# Patient Record
Sex: Female | Born: 2000 | Race: Black or African American | Hispanic: No | Marital: Single | Smoking: Never smoker
Health system: Southern US, Community
[De-identification: ages and names within clinical notes are randomized; demographics above are authoritative.]

---
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
## 2019-03-06 IMAGING — CR DG CHEST 2V
2 series · 2 of 2 positions shown · non-contrast
Comparison: None.

CLINICAL DATA: Cough, congestion.

EXAM:
CHEST - 2 VIEW

[w chest pa]
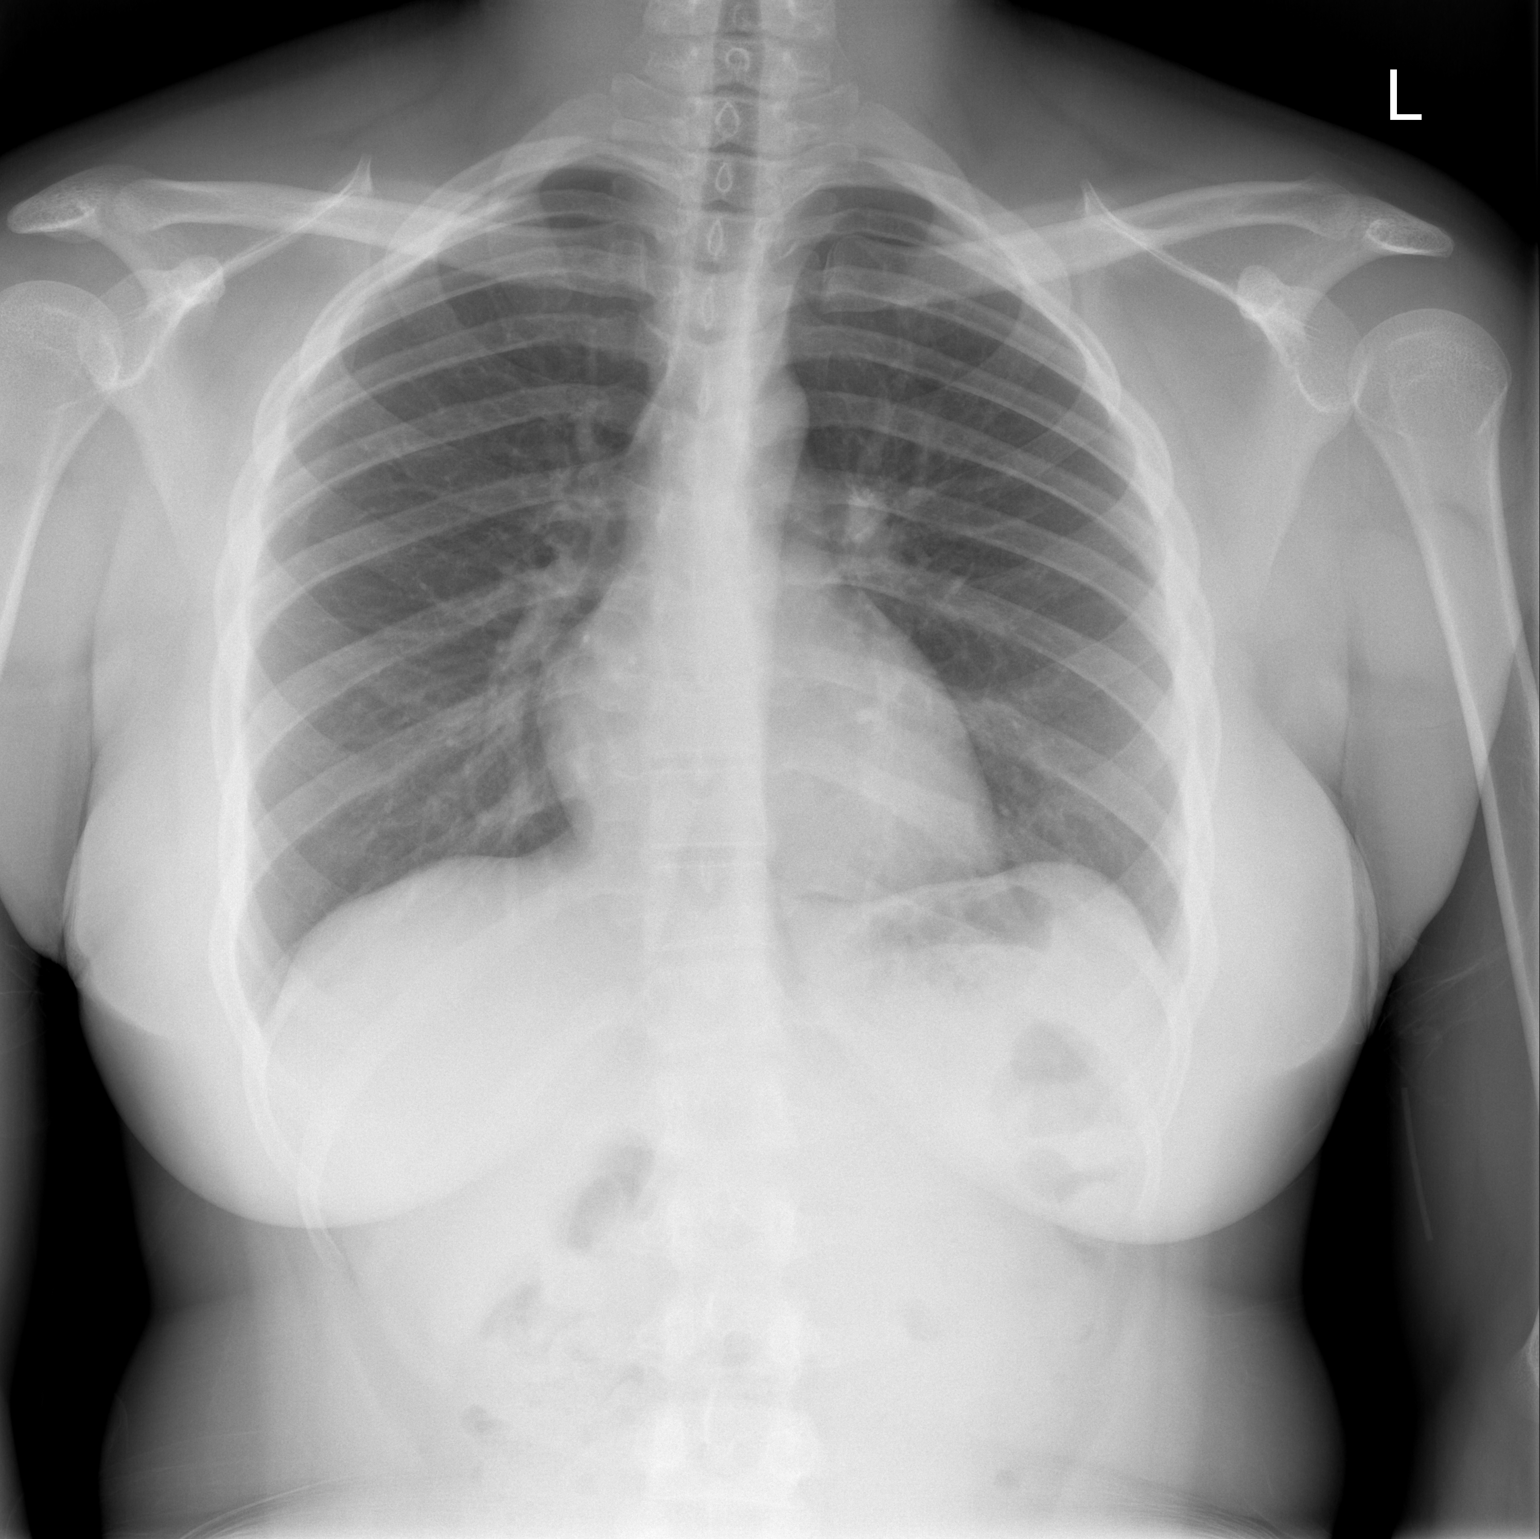

[w chest lat]
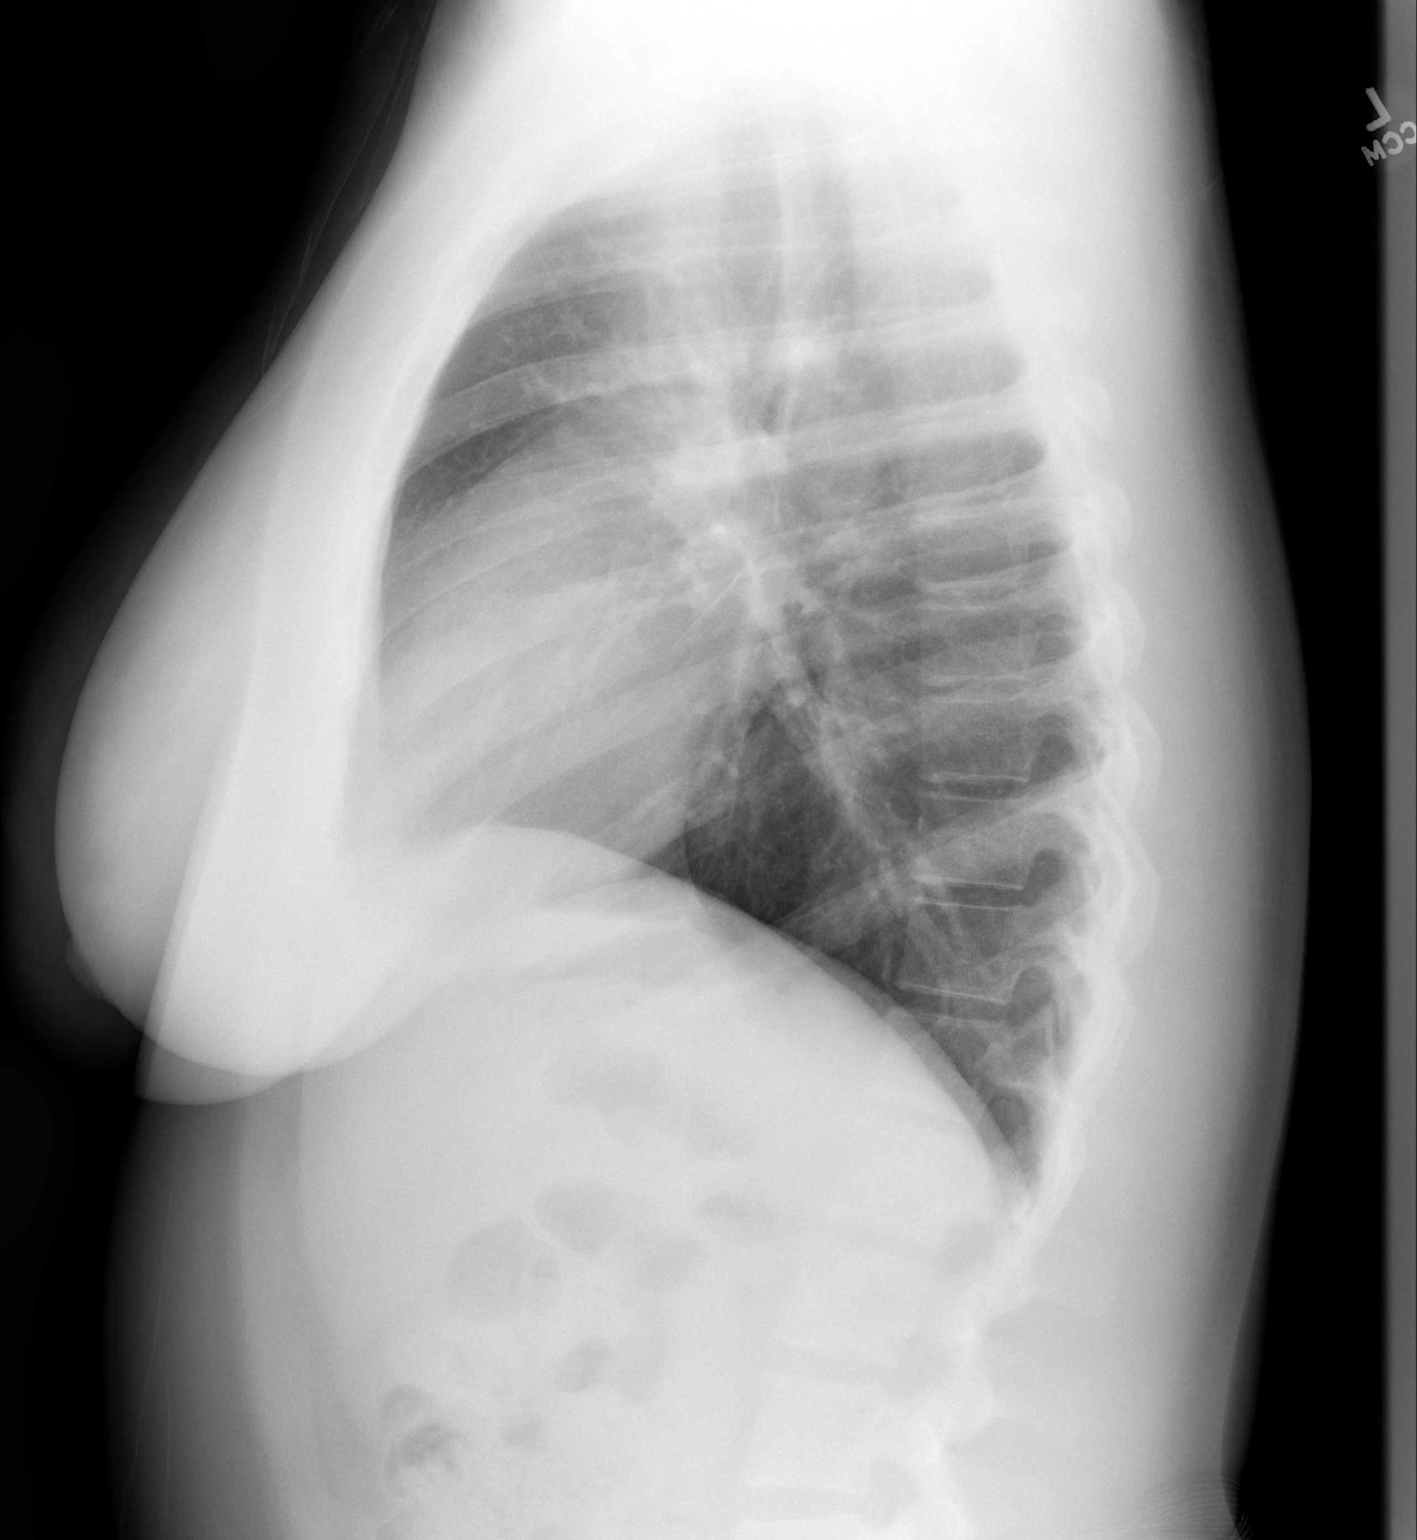

[2 of 2 positions shown; findings below may reference images not displayed]

FINDINGS: The heart size and mediastinal contours are within normal limits.
Both lungs are clear. No pneumothorax or pleural effusion is noted.
The visualized skeletal structures are unremarkable.
IMPRESSION: No active cardiopulmonary disease.

## 2020-07-12 ENCOUNTER — Emergency Department
Admission: EM | Admit: 2020-07-12 | Discharge: 2020-07-12 | Disposition: A | Discharge status: Home / Self Care | Payer: Medicaid Other | Attending: Pediatrics | Admitting: Pediatrics

## 2020-07-12 DIAGNOSIS — U071 COVID-19: Secondary | ICD-10-CM | POA: Insufficient documentation

## 2020-07-12 DIAGNOSIS — J069 Acute upper respiratory infection, unspecified: Secondary | ICD-10-CM

## 2020-07-12 LAB — COVID-19 (SARS-COV-2) & INFLUENZA  A/B, NAA (ROCHE LIAT)
Influenza A: NOT DETECTED
Influenza B: NOT DETECTED
SARS CoV 2 Overall Result: NOT DETECTED

## 2020-07-12 NOTE — ED Provider Notes (Signed)
[facility] PEDIATRIC EMERGENCY DEPARTMENT H&P                                             ATTENDING SUPERVISORY NOTE      Visit date: 07/12/2020      CLINICAL SUMMARY          Diagnosis:    .     Final diagnoses:   COVID   Viral URI         MDM Notes:      20 y.o female with likely viral illness. Non-toxic appearing and in no respiratory distress. Covid/Influenza pending. Tylenol/motrin for fever. F/U with PCP.          Disposition:         Discharge         Discharge Prescriptions     None                      CLINICAL INFORMATION        HPI:        Chief Complaint: Sore Throat  .    [name] is a 20 y.o. female who presents with 1 week of cough, congestion and sore throat. No fever. Family at home with similar symptoms. +COVID exposure. No shortness of breath. Pt is not COVID vaccinated.     History obtained from: Patient      ROS:      Positive and negative ROS elements as per HPI.  All other systems reviewed and negative.      Physical Exam:      Pulse (!) 126   BP 129/85   Resp 18   SpO2 97 %   Temp 97.5 F (36.4 C)   Wt 80.5 kg    Physical Exam  Constitutional:       Appearance: She is well-developed.   HENT:      Head: Normocephalic and atraumatic.   Eyes:      Conjunctiva/sclera: Conjunctivae normal.      Pupils: Pupils are equal, round, and reactive to light.   Cardiovascular:      Rate and Rhythm: Normal rate and regular rhythm.      Heart sounds: Normal heart sounds, S1 normal and S2 normal.   Pulmonary:      Effort: Pulmonary effort is normal.      Breath sounds: Normal breath sounds.   Abdominal:      General: Bowel sounds are normal.      Palpations: Abdomen is soft.   Musculoskeletal:         General: Normal range of motion.      Cervical back: Normal range of motion and neck supple.   Skin:     General: Skin is warm.   Neurological:      Mental Status: She is alert and oriented to person, place, and time.                 PAST HISTORY        Primary Care Provider: No  primary care provider on file.        PMH/PSH:    .     History reviewed. No pertinent past medical history.    She has no past surgical history on file.      Social/Family History:      Pediatric History  Patient Parents    Not on file     Other Topics Concern    Not on file   Social History Narrative    Not on file     Social History     Tobacco Use    Smoking status: Not on file    Smokeless tobacco: Not on file   Substance Use Topics    Alcohol use: Not on file     No family history on file.      Listed Medications on Arrival:    .     Home Medications     Med List Status: Complete Set By: Artist Pais, RN at 07/12/2020 10:40 PM        No Medications          Allergies: She has No Known Allergies.            VISIT INFORMATION        Clinical Course in the ED:                   Medications Given in the ED:    .     ED Medication Orders (From admission, onward)    None            Procedures:      Procedures      Interpretations:      O2 sat-                   saturation: 97 %; Oxygen use: room air; Interpretation: Normal                 RESULTS        Lab Results:      Results     ** No results found for the last 24 hours. **              Radiology Results:      No orders to display               Supervisory Statements:      I have reviewed and agree with the history except as noted above. The pertinent physical exam has been documented.  I have reviewed and agree with the final ED diagnosis.      Scribe Attestation:      I was acting as a Neurosurgeon for Jake Samples, MD on [employer]  Treatment Team: Scribe: Orlean Patten     I am the first provider for this patient and I personally performed the services documented. Orlean Patten is scribing for me on [name],[name]. This note and the patient instructions accurately reflect work and decisions made by me.  Jake Samples, MD

## 2020-07-12 NOTE — ED Triage Notes (Signed)
Pt with sore throat and cough for approx 1 week. Pt with COVID exposure on 07/03/20. Pt states a few days ago she coughed up bloody phlegm once. No meds PTA. Pt arrives alert, easy work of breathing, well perfused. Pt masked.

## 2020-07-13 NOTE — ED Provider Notes (Addendum)
[name] PEDIATRIC EMERGENCY DEPARTMENT FELLOW H&P      Visit date: 07/12/2020      CLINICAL SUMMARY          Diagnosis:    .     Final diagnoses:   COVID   Viral URI         MDM Notes:      Likely viral febrile syndrome. Reassuring physical exam and vitals with no obvious source of bacterial infection. Specifically no evidence of tachypnea, work of breathing, cough, rhinorrhea/congestion, emesis/nausea/decreased appetite/diarrhea, ear infection or posterior oropharynx erythema..    -covid swab after discussion with family  -f/u pmd prn  -symptomatic care including tylenol/motrin and fluids           Disposition:         Discharge             Discharge Prescriptions     None                      CLINICAL INFORMATION        HPI:      Chief Complaint: Sore Throat  .    [name] is a 20 y.o. female with no significant past medical history who presents with concern for 1 week of viral symptoms    URI symptoms including cough congestion sore throat.  No fever.  Family with similar symptoms over the past week.  Covid exposure during this time.. No concern for hypoxia, cyanosis, increased work of breathing, shortness of breath or chest pain.  Immunizations up-to-date but has not received COVID-19      ROS:      Positive and negative ROS elements as per HPI.  Other systems reviewed and negative.    Specifically denies fevers, wheeze/sneeze, throwing up/diarrhea, muscle pain/joint pain, rash.      Physical Exam:      Vitals:    07/12/20 2235 07/12/20 2242   BP: 129/85    Pulse: (!) 126    Resp: 18    Temp: 97.5 F (36.4 C)    TempSrc: Tympanic    SpO2: 97% (S) 95%   Weight: 80.5 kg        Physical Exam   Constitutional: well-developed, well-nourished, and in no distress.  Right Ear: External ear normal. Tympanic membrane without effusion/erythema  Left Ear: External ear normal. Tympanic membrane without effusion/erythema  Mouth/Throat: Oropharynx is clear and moist.   Eyes: Pupils are equal,  round, and reactive to light.   Neck: Normal range of motion.   Cardiovascular: Normal rate and regular rhythm. No murmur heard.   Pulmonary/Chest: Effort normal and breath sounds normal. No wheeze or crackle appreciated  Abdominal: Soft. There is no abdominal tenderness.   Musculoskeletal:    No deformity or edema. Cap refill <3s in all 4 extremities  Lymphadenopathy: No cervical adenopathy.   Neurological: Alert. Moving all 4 extremities equally   Skin: Skin is warm and dry. No rash noted.             PAST HISTORY        Primary Care Provider: No primary care provider on file.        PMH/PSH:    .     History reviewed. No pertinent past medical history.    She has no past surgical history on file.      Social/Family History:      Pediatric History   Patient Parents    Not  on file     Other Topics Concern    Not on file   Social History Narrative    Not on file     Social History     Tobacco Use    Smoking status: Not on file    Smokeless tobacco: Not on file   Substance Use Topics    Alcohol use: Not on file       No family history on file.      Listed Medications on Arrival:    .     Home Medications     Med List Status: Complete Set By: Artist Pais, RN at 07/12/2020 10:40 PM        No Medications          Allergies: She has No Known Allergies.            VISIT INFORMATION        Clinical Course in the ED:        See documentation in MDM as above           Medications Given in the ED:    .     ED Medication Orders (From admission, onward)    None            Procedures:      Procedures      Interpretations:                RESULTS        Lab Results:      Results     ** No results found for the last 24 hours. **              Radiology Results:      No orders to display

## 2021-07-18 ENCOUNTER — Emergency Department
Admission: EM | Admit: 2021-07-18 | Discharge: 2021-07-18 | Disposition: A | Discharge status: Home / Self Care | Payer: Medicaid Other | Attending: Emergency Medicine | Admitting: Emergency Medicine

## 2021-07-18 DIAGNOSIS — H539 Unspecified visual disturbance: Secondary | ICD-10-CM | POA: Insufficient documentation

## 2021-07-18 MED ORDER — BUTALBITAL-APAP-CAFFEINE 50-325-40 MG PO TABS
1.0000 | ORAL_TABLET | ORAL | 0 refills | Status: AC | PRN
Start: 2021-07-18 — End: ?
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
## 2021-07-18 NOTE — ED Provider Notes (Signed)
History     Chief Complaint   Patient presents with    Loss of Vision     21 year old female without significant past medical history here with  mild constant bilateral floaters.  Denies any visual loss.  Also states she had cayenne pepperr spread in her eye accidentally yesterday.  No burning.  No headache.  No prior ophthalmology issues.  No history of complex migraine       History reviewed. No pertinent past medical history.    History reviewed. No pertinent surgical history.    History reviewed. No pertinent family history.    Social  Social History     Tobacco Use    Smoking status: Never    Smokeless tobacco: Never   Vaping Use    Vaping Use: Never used   Substance Use Topics    Alcohol use: Never    Drug use: Yes     Comment: daily cannabis use       .     No Known Allergies    Home Medications       No Medications             Review of Systems   Eyes:  Positive for visual disturbance.   All other systems reviewed and are negative.    Physical Exam    BP: 142/67, Heart Rate: 85, Temp: 97.5 F (36.4 C), Resp Rate: 18, SpO2: 100 %, Weight: 73.9 kg    Physical Exam  Vitals and nursing note reviewed.   Constitutional:       Appearance: Normal appearance.   Eyes:      General:         Right eye: No discharge.         Left eye: No discharge.      Extraocular Movements: Extraocular movements intact.      Conjunctiva/sclera: Conjunctivae normal.      Pupils: Pupils are equal, round, and reactive to light.      Comments: Fluorescein uptake  IOP 14 bilateral  No retinal detachment by sono   Skin:     General: Skin is warm and dry.   Neurological:      General: No focal deficit present.      Mental Status: She is alert and oriented to person, place, and time.   Psychiatric:         Mood and Affect: Mood normal.         Behavior: Behavior normal.         Thought Content: Thought content normal.         Judgment: Judgment normal.         MDM and ED Course     ED Medication Orders (From admission, onward)      None                Medical Decision Making  Risk  Prescription drug management.             DDX abrasion, retinal detachment, occular migraine      Procedures    Clinical Impression & Disposition     Clinical Impression  Final diagnoses:   Visual disturbance        ED Disposition       ED Disposition   Discharge    Condition   --    Date/Time   Sat Jul 18, 2021  5:57 PM    Comment   Linsay Lindell NoeRenee Cheema discharge to home/self care.  Condition at disposition: Stable                  Discharge Medication List as of 07/18/2021  5:58 PM        START taking these medications    Details   butalbital-acetaminophen-caffeine (FIORICET) 50-325-40 MG per tablet Take 1 tablet by mouth every 4 (four) hours as needed for Headaches, Starting Sat 07/18/2021, Print                D/c follow up optho  No s/s detachment           Elesa Hacker, MD  07/18/21 2312

## 2022-10-26 ENCOUNTER — Observation Stay: Admit: 2022-10-26 | Payer: Medicaid Other

## 2023-06-13 ENCOUNTER — Encounter (HOSPITAL_BASED_OUTPATIENT_CLINIC_OR_DEPARTMENT_OTHER): Payer: Self-pay
